# Patient Record
Sex: Male | Born: 1990 | Race: Black or African American | Hispanic: No | Marital: Single | Smoking: Never smoker
Health system: Southern US, Community
[De-identification: ages and names within clinical notes are randomized; demographics above are authoritative.]

## PROBLEM LIST (undated history)

## (undated) DIAGNOSIS — D573 Sickle-cell trait: Secondary | ICD-10-CM

## (undated) HISTORY — DX: Sickle-cell trait: D57.3

---
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
## 2012-03-25 ENCOUNTER — Ambulatory Visit (INDEPENDENT_AMBULATORY_CARE_PROVIDER_SITE_OTHER): Payer: Self-pay | Admitting: Family Medicine

## 2012-03-25 ENCOUNTER — Encounter: Payer: Self-pay | Admitting: Family Medicine

## 2012-03-25 VITALS — BP 127/84 | HR 114 | Temp 98.9°F | Ht 72.0 in | Wt 165.0 lb

## 2012-03-25 DIAGNOSIS — R109 Unspecified abdominal pain: Secondary | ICD-10-CM | POA: Insufficient documentation

## 2012-03-25 DIAGNOSIS — B360 Pityriasis versicolor: Secondary | ICD-10-CM

## 2012-03-25 DIAGNOSIS — R000 Tachycardia, unspecified: Secondary | ICD-10-CM

## 2012-03-25 DIAGNOSIS — I498 Other specified cardiac arrhythmias: Secondary | ICD-10-CM

## 2012-03-25 LAB — CBC
HCT: 41 % (ref 39.0–52.0)
MCV: 69.5 fL — ABNORMAL LOW (ref 78.0–100.0)
Platelets: 298 10*3/uL (ref 150–400)
RBC: 5.9 MIL/uL — ABNORMAL HIGH (ref 4.22–5.81)
WBC: 3.2 10*3/uL — ABNORMAL LOW (ref 4.0–10.5)

## 2012-03-25 LAB — COMPREHENSIVE METABOLIC PANEL
CO2: 27 mEq/L (ref 19–32)
Calcium: 9.7 mg/dL (ref 8.4–10.5)
Creat: 1.24 mg/dL (ref 0.50–1.35)
Glucose, Bld: 89 mg/dL (ref 70–99)
Total Bilirubin: 0.8 mg/dL (ref 0.3–1.2)

## 2012-03-25 NOTE — Assessment & Plan Note (Signed)
Treat with selsun blue

## 2012-03-25 NOTE — Progress Notes (Signed)
  Subjective:    Patient ID: Gregory Lucero, male    DOB: 10-04-1990, 21 y.o.   MRN: [id number]  HPI Here to reestablish care  Pain in bilateral abdomen and low back.  On and off for a few days.  Not severe.  Slightly worse with movement.  No nausea or vomiting or fever or diarrhea or sick contacts.  Is not having the pain now  Rash - hypopigmented blotches on back.  No other symptoms.  No new medications  PMH Elevated LFTs - last check had resolved Tachycardia - had been on metroprolol not taking now  Patient reports no  vision/ hearing changes,anorexia, weight change, fever ,adenopathy, persistant / recurrent hoarseness, swallowing issues, chest pain, edema,persistant / recurrent cough, hemoptysis, dyspnea(rest, exertional, paroxysmal nocturnal), gastrointestinal  bleeding (melena, rectal bleeding), , excessive heart burn, GU symptoms(dysuria, hematuria, pyuria, voiding/incontinence  Issues) syncope, focal weakness, severe memory loss,, depression, anxiety, abnormal bruising/bleeding, major joint swelling.     Review of Systems     Objective:   Physical Exam Neck:  No deformities, thyromegaly, masses, or tenderness noted.   Supple with full range of motion without pain. Mouth - no lesions, mucous membranes are moist, no decaying teeth  Heart - Regular rate and rhythm.  No murmurs, gallops or rubs.   Was tachycardic after moving on to exam table but returned to normal at rest Lungs:  Normal respiratory effort, chest expands symmetrically. Lungs are clear to auscultation, no crackles or wheezes. Abdomen: soft and non-tender without masses, organomegaly or hernias noted.  No guarding or rebound Extremities:  No cyanosis, edema, or deformity noted with good range of motion of all major joints.  Skin:  Intact without suspicious lesions does have faint hypopigmented lesion on back consistent with tinea          Assessment & Plan:

## 2012-03-25 NOTE — Patient Instructions (Addendum)
Monitor your pulse and blood pressure once a week The pulse should be between 60-90 when you are resting Your blood pressure should be less than 140/90 If the readings are regularly above these number then come back  I will call you if your tests are not good.  Otherwise I will send you a letter.  If you do not hear from me with in 2 weeks please call our office.     Use Selsun blue on your back First time use for 20 minutes then wash off if no bad reaction then leave on over night repeat again in one week  If the abdomen pain gets worse especially with nausea or vomiting or high fever > 101 then call me

## 2012-03-25 NOTE — Assessment & Plan Note (Signed)
No longer taking metoprolol.  No symptoms. Exercising regularly without problems

## 2012-03-25 NOTE — Assessment & Plan Note (Signed)
No red flags on history or exam but given his history of LFT elevation will check labs

## 2012-03-28 ENCOUNTER — Encounter: Payer: Self-pay | Admitting: Family Medicine

## 2012-09-30 ENCOUNTER — Telehealth: Payer: Self-pay | Admitting: *Deleted

## 2012-09-30 NOTE — Telephone Encounter (Signed)
Chart reviewed, pt has not been taking med/ last ov with PCP shows HR 114, and Dr confirmed pt is not taking medication. I called his pharmacy and the last time med was picked up was 10/29/2011. I called pt and left msg that letter can not be written as requested since things are not resolved. Pt needs yearly app. Asked pt to call back/ number provided.

## 2012-09-30 NOTE — Telephone Encounter (Signed)
Message copied by Antony Odea on Mon Sep 30, 2012 10:20 AM ------      Message from: Erle Crocker D      Created: Mon Sep 30, 2012  9:54 AM      Regarding: Letter Wrote       Jodette,            Pt Left Voicemail on my phone asking For a Note to Be       Wrote on [employer]. Please call him back at      [phone]            Thanks,Kim  ------

## 2012-09-30 NOTE — Telephone Encounter (Signed)
Message copied by Antony Odea on Mon Sep 30, 2012 10:49 AM ------      Message from: Erle Crocker D      Created: Mon Sep 30, 2012 10:41 AM      Regarding: Letter        Jodette,            Pt called back in and stated his Note Need to say:      1.Dates of Service      2.Effect medicine Had      3. What Was Done to Resolve Situation & everything Is       Under Control now.            Thanks,Kim        ------

## 2012-09-30 NOTE — Telephone Encounter (Signed)
msg left to call back and let me know what kind of information needs to be on the letter, number provided.

## 2012-10-11 NOTE — Telephone Encounter (Signed)
Pt is aware that he needs to be seen prior Md writes the letter  Requested. Pt states did not refill the  Medication, because his  BP was doing fine, and he did not need to take the medication, but he needed to continue execessing   per his  PCP. Pt was offered to make  A yearly  Appointment, but pt needs to ask his  mother first, so he will call back to make the appointment.

## 2012-10-11 NOTE — Telephone Encounter (Signed)
Follow up  Pt states he needs a letter from the doctor explaining his condition, the medication and the dates of services that he saw the doctor.  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

## 2012-10-21 ENCOUNTER — Telehealth: Payer: Self-pay | Admitting: *Deleted

## 2012-10-21 NOTE — Telephone Encounter (Signed)
Spoke with his mother explaining that the medication is used for HR control, told her he needs a follow up app. She said she will call back and make an app, spent 10 minutes discussing med/ exercise and hr and its long term effects. Mother verbalized understanding.

## 2012-10-21 NOTE — Telephone Encounter (Signed)
Spoke with his mother explaining that the medication is used for HR control, told her he needs a follow up app. She said she will call back and make an app, spent 10 minutes discussing med/ exercise and hr and its long term effects. Mother verbalized understanding. 

## 2013-02-10 ENCOUNTER — Ambulatory Visit (INDEPENDENT_AMBULATORY_CARE_PROVIDER_SITE_OTHER): Payer: Self-pay | Admitting: Family Medicine

## 2013-02-10 ENCOUNTER — Encounter: Payer: Self-pay | Admitting: Family Medicine

## 2013-02-10 VITALS — BP 169/81 | HR 125 | Temp 99.0°F | Wt 151.0 lb

## 2013-02-10 DIAGNOSIS — R634 Abnormal weight loss: Secondary | ICD-10-CM | POA: Insufficient documentation

## 2013-02-10 DIAGNOSIS — R109 Unspecified abdominal pain: Secondary | ICD-10-CM

## 2013-02-10 LAB — CBC WITH DIFFERENTIAL/PLATELET
Basophils Absolute: 0 10*3/uL (ref 0.0–0.1)
Basophils Relative: 1 % (ref 0–1)
Eosinophils Absolute: 0 10*3/uL (ref 0.0–0.7)
Eosinophils Relative: 0 % (ref 0–5)
Lymphs Abs: 0.8 10*3/uL (ref 0.7–4.0)
MCH: 22.4 pg — ABNORMAL LOW (ref 26.0–34.0)
MCHC: 32.7 g/dL (ref 30.0–36.0)
MCV: 68.4 fL — ABNORMAL LOW (ref 78.0–100.0)
Neutrophils Relative %: 74 % (ref 43–77)
Platelets: 266 10*3/uL (ref 150–400)
RBC: 5.8 MIL/uL (ref 4.22–5.81)
RDW: 15.6 % — ABNORMAL HIGH (ref 11.5–15.5)

## 2013-02-10 LAB — COMPREHENSIVE METABOLIC PANEL
AST: 18 U/L (ref 0–37)
Albumin: 4.4 g/dL (ref 3.5–5.2)
BUN: 7 mg/dL (ref 6–23)
Calcium: 9 mg/dL (ref 8.4–10.5)
Chloride: 104 mEq/L (ref 96–112)
Glucose, Bld: 83 mg/dL (ref 70–99)
Potassium: 4.1 mEq/L (ref 3.5–5.3)
Sodium: 140 mEq/L (ref 135–145)
Total Protein: 6.7 g/dL (ref 6.0–8.3)

## 2013-02-10 LAB — POCT SEDIMENTATION RATE: POCT SED RATE: 2 mm/hr (ref 0–22)

## 2013-02-10 NOTE — Assessment & Plan Note (Signed)
Long standing but now worsened with weight loss.  No specific clues on exam.  Will check labs and depending on findings perhaps proceed with imaging

## 2013-02-10 NOTE — Progress Notes (Signed)
  Subjective:    Patient ID: Gregory Lucero, male    DOB: 1990-10-25, 22 y.o.   MRN: [id number]  HPI  Weight Loss Abdomen Pain Has lost 15 lbs in last year.  Prior to that was heavier but was "eating on meal plan at school" Reports and erractic schedule and sometimes does not eat a lot.  Has constant left sided upper abdomen pain that started as intermittent a year ago.  Hard to describe the pain.  Worse with movement.  "Feels like something is there".  No nausea or vomiting or diarrhea. Occasional has BRB on toilet paper.  No fevers or night sweats or adenopathy.  Does have frequent sinus headaches. Has had a nose bleed before. Was exercising regularly but stopped a week or so ago due to weight loss.   Review of Symptoms - see HPI  PMH - Smoking status noted.      Review of Systems     Objective:   Physical Exam  Alert no acute distress healthy appearing Neck:  No deformities, thyromegaly, masses, or tenderness noted.   Supple with full range of motion without pain. Mouth - no lesions, mucous membranes are moist, no decaying teeth  Heart - Regular rate and rhythm.  No murmurs, gallops or rubs.    Lungs:  Normal respiratory effort, chest expands symmetrically. Lungs are clear to auscultation, no crackles or wheezes. Abdomen: soft and essentially non-tender without masses, organomegaly or hernias noted.  No guarding or rebound.  When push deeply in LUQ mild discomfort No groin or axillary or neck adenopathy Extremities:  No cyanosis, edema, or deformity noted with good range of motion of all major joints.         Assessment & Plan:

## 2013-02-10 NOTE — Assessment & Plan Note (Signed)
New 15lbs in last year involuntary.  No red flags on history or exam.  Will check screening labs and follow closely

## 2013-02-10 NOTE — Patient Instructions (Signed)
Good to see you today!  Thanks for coming in.  I will call you/Mom with the results of the blood tests  Keep a list of every thing you eat for the next several days

## 2013-02-13 ENCOUNTER — Telehealth: Payer: Self-pay | Admitting: Family Medicine

## 2013-02-13 ENCOUNTER — Encounter: Payer: Self-pay | Admitting: Family Medicine

## 2013-02-13 DIAGNOSIS — R109 Unspecified abdominal pain: Secondary | ICD-10-CM

## 2013-02-13 DIAGNOSIS — R634 Abnormal weight loss: Secondary | ICD-10-CM

## 2013-02-13 MED ORDER — OMEPRAZOLE 20 MG PO CPDR: 20.0000 mg | DELAYED_RELEASE_CAPSULE | Freq: Every day | ORAL | Status: DC

## 2013-02-13 NOTE — Assessment & Plan Note (Signed)
Sent letter detailing -  Eat a large balanced diet every day  2. Don't do any exercises that make your stomach/side hurt 3.  Try omeprazole 20 mg every AM for 2 weeks to see if the abdomen pain is better - I sent to your drug store 4.  Weigh yourself every week.  If you continue to lose weight come back to our office in December  If weight loss persists would obtain Abdomen CT

## 2013-02-13 NOTE — Telephone Encounter (Signed)
Spoke with his mom - see letter for details

## 2013-02-13 NOTE — Assessment & Plan Note (Signed)
Lab work up unrevealing.  Try course of PPI and monitor weight and symptoms

## 2013-03-23 ENCOUNTER — Telehealth: Payer: Self-pay | Admitting: Family Medicine

## 2013-03-23 ENCOUNTER — Emergency Department (HOSPITAL_COMMUNITY)
Admission: EM | Admit: 2013-03-23 | Discharge: 2013-03-23 | Disposition: A | Discharge status: Home / Self Care | Payer: Medicaid Other | Attending: Emergency Medicine | Admitting: Emergency Medicine

## 2013-03-23 ENCOUNTER — Encounter (HOSPITAL_COMMUNITY): Payer: Self-pay | Admitting: Emergency Medicine

## 2013-03-23 DIAGNOSIS — R109 Unspecified abdominal pain: Secondary | ICD-10-CM

## 2013-03-23 DIAGNOSIS — R112 Nausea with vomiting, unspecified: Secondary | ICD-10-CM | POA: Insufficient documentation

## 2013-03-23 DIAGNOSIS — R509 Fever, unspecified: Secondary | ICD-10-CM | POA: Insufficient documentation

## 2013-03-23 DIAGNOSIS — G8929 Other chronic pain: Secondary | ICD-10-CM | POA: Insufficient documentation

## 2013-03-23 DIAGNOSIS — R197 Diarrhea, unspecified: Secondary | ICD-10-CM | POA: Insufficient documentation

## 2013-03-23 DIAGNOSIS — R634 Abnormal weight loss: Secondary | ICD-10-CM | POA: Insufficient documentation

## 2013-03-23 DIAGNOSIS — R630 Anorexia: Secondary | ICD-10-CM | POA: Insufficient documentation

## 2013-03-23 DIAGNOSIS — Z79899 Other long term (current) drug therapy: Secondary | ICD-10-CM | POA: Insufficient documentation

## 2013-03-23 DIAGNOSIS — R1031 Right lower quadrant pain: Secondary | ICD-10-CM | POA: Insufficient documentation

## 2013-03-23 DIAGNOSIS — Z862 Personal history of diseases of the blood and blood-forming organs and certain disorders involving the immune mechanism: Secondary | ICD-10-CM | POA: Insufficient documentation

## 2013-03-23 DIAGNOSIS — R51 Headache: Secondary | ICD-10-CM | POA: Insufficient documentation

## 2013-03-23 LAB — COMPREHENSIVE METABOLIC PANEL
AST: 27 U/L (ref 0–37)
Albumin: 4.2 g/dL (ref 3.5–5.2)
Alkaline Phosphatase: 80 U/L (ref 39–117)
BUN: 9 mg/dL (ref 6–23)
Chloride: 101 mEq/L (ref 96–112)
Potassium: 4 mEq/L (ref 3.5–5.1)
Total Bilirubin: 0.6 mg/dL (ref 0.3–1.2)
Total Protein: 7.5 g/dL (ref 6.0–8.3)

## 2013-03-23 LAB — CBC WITH DIFFERENTIAL/PLATELET
Basophils Absolute: 0 10*3/uL (ref 0.0–0.1)
Basophils Relative: 1 % (ref 0–1)
Eosinophils Absolute: 0.1 10*3/uL (ref 0.0–0.7)
MCH: 23 pg — ABNORMAL LOW (ref 26.0–34.0)
MCHC: 33.2 g/dL (ref 30.0–36.0)
Monocytes Relative: 14 % — ABNORMAL HIGH (ref 3–12)
Neutro Abs: 2.4 10*3/uL (ref 1.7–7.7)
Neutrophils Relative %: 58 % (ref 43–77)
Platelets: 237 10*3/uL (ref 150–400)
RDW: 14.6 % (ref 11.5–15.5)

## 2013-03-23 LAB — LIPASE, BLOOD: Lipase: 34 U/L (ref 11–59)

## 2013-03-23 NOTE — Telephone Encounter (Signed)
Error

## 2013-03-23 NOTE — Telephone Encounter (Signed)
[facility] Emergency Line   Patient calling with pain in lower right stomach that he thought was appendicitis but is not sure. He describes pain as "extreme" making it difficult for him to move. Pain lasted 1 hour and went away. He also thinks it could have been something he ate because he remembers eating "bad meat." He has never had similar pain and also thinks it could have been bad gas. Pain is now completely gone on his right side but his chronic left abdominal pain is still present. He does not go into detail when asked what this pain is from.  He reports nausea, vomiting, loss of appetite, fevers, and chills.   On review of PMH, pt has h/o sinus tachycardia, weight loss, and abdominal pain with unrevealing lab workup. Per his PCP, he is to try a course of PPI and follow up.   Assessment / Plan: 22 y.o. male with h/o abdominal pain on PPI, weight loss, and sinus tachycardia calling with a 1 hour episode of severe abdominal pain in RLQ that has resolved. DDx includes appendicitis, gas, gastritis, gastroenteritis. With fever, severe RLQ abdominal pain, and vomiting, high concern for appendicitis though gastroenteritis is much more likely. - Explained to Gregory Lucero and his mother that I am limited over the phone and cannot perform a needed physical exam. - Gregory Lucero to go to Urgent Care or the Emergency Room today for evaluation since clinic is closed.  - Pt and mother voice understanding.  Gregory Singleton, MD

## 2013-03-23 NOTE — ED Notes (Signed)
He c/o lower abd. Pain plus one episode of emesis two days ago.  He is in no distress.

## 2013-03-23 NOTE — ED Provider Notes (Signed)
I saw and evaluated the patient, reviewed the resident's note and I agree with the findings and plan.   .Face to face Exam:  General:  Awake HEENT:  Atraumatic Resp:  Normal effort Abd:  Nondistended Neuro:No focal weakness    Nelia Shi, MD 03/23/13 1139

## 2013-03-23 NOTE — ED Provider Notes (Signed)
CSN: [id number]     Arrival date & time 03/23/13  4098 History   None    Chief Complaint  Patient presents with  . Abdominal Pain   (Consider location/radiation/quality/duration/timing/severity/associated sxs/prior Treatment) HPI Gregory Lucero is a 22 y.o. male w/ PMHx of Sickle Cell Trait, presents to the ED w/ complaints of abdominal pain. The patient claims his abdominal discomfort began last Friday after he thinks he ate some spoiled ground beef, resulting in abdominal pain, nausea, vomiting, diarrhea, and subjective fever and chills. For the last 2 days now he complains of lower abdominal pain, mainly in the RLQ, 8/10 in severity, at its worst, now he has no pain whatsoever. The patient claims to have had associated decrease in appetite, nausea, and still w/ diarrhea.  Follows w/ PCP, Dr. Deirdre Priest, who has worked him up for some chronic abdominal pain as well as weight loss, all previous labs and workup negative.  Otherwise, patient denies any dizziness, lightheadedness, palpitations, SOB, chest pain, dysuria, or hematuria.`  Past Medical History  Diagnosis Date  . Sickle cell trait    History reviewed. No pertinent past surgical history. History reviewed. No pertinent family history. History  Substance Use Topics  . Smoking status: Never Smoker   . Smokeless tobacco: Not on file  . Alcohol Use: No    Review of Systems General: Positive for subjective fever/chills, decreased appetite. Denies diaphoresis and fatigue.  Respiratory: Denies SOB, DOE, cough, chest tightness, and wheezing.   Cardiovascular: Denies chest pain, palpitations and leg swelling.  Gastrointestinal: Positive for nausea, vomiting, abdominal pain, and diarrhea. Denies constipation, blood in stool and abdominal distention.  Genitourinary: Denies dysuria, urgency, frequency, hematuria, flank pain and difficulty urinating.  Endocrine: Denies hot or cold intolerance, sweats, polyuria,  polydipsia. Musculoskeletal: Denies myalgias, back pain, joint swelling, arthralgias and gait problem.  Skin: Denies pallor, rash and wounds.  Neurological: Positive for intermittent headaches. Denies dizziness, seizures, syncope, weakness, lightheadedness, numbness.  Psychiatric/Behavioral: Denies mood changes, confusion, nervousness, sleep disturbance and agitation.  Allergies  Review of patient's allergies indicates no known allergies.  Home Medications   Current Outpatient Rx  Name  Route  Sig  Dispense  Refill  . calcium carbonate (OS-CAL) 1250 MG chewable tablet   Oral   Chew 2 tablets by mouth daily.         Marland Kitchen senna-docusate (SENOKOT-S) 8.6-50 MG per tablet   Oral   Take 1 tablet by mouth once.          Physical Exam Filed Vitals:   03/23/13 1001  BP: 125/81  Pulse: 75  Temp: 98 F (36.7 C)  TempSrc: Oral  Resp: 16  Height: 6' (1.829 m)  Weight: 152 lb (68.947 kg)  SpO2: 100%  General: Vital signs reviewed.  Patient is a well-developed and well-nourished, in no acute distress and cooperative with exam. Alert and oriented x3.  Head: Normocephalic and atraumatic. Eyes: PERRL, EOMI, conjunctivae normal, No scleral icterus.  Neck: Supple, trachea midline, normal ROM, No JVD, masses, thyromegaly, or carotid bruit present.  Cardiovascular: RRR, S1 normal, S2 normal, no murmurs, gallops, or rubs. Pulmonary/Chest: Normal respiratory effort, CTAB, no wheezes, rales, or rhonchi. Abdominal: Soft, non-tender, non-distended, bowel sounds are normal, no masses, organomegaly, or guarding present.  Musculoskeletal: No joint deformities, erythema, or stiffness, ROM full and no nontender. Extremities: No swelling or edema,  pulses symmetric and intact bilaterally. No cyanosis or clubbing. Neurological: A&O x3, Strength is normal and symmetric bilaterally, cranial nerve II-XII are  grossly intact, no focal motor deficit, sensory intact to light touch bilaterally.  Skin: Warm, dry  and intact. No rashes or erythema. Psychiatric: Normal mood and affect. speech and behavior is normal. Cognition and memory are normal.   ED Course  Procedures (including critical care time) Labs Review Labs Reviewed  CBC WITH DIFFERENTIAL - Abnormal; Notable for the following:    RBC 5.95 (*)    MCV 69.4 (*)    MCH 23.0 (*)    Monocytes Relative 14 (*)    All other components within normal limits  COMPREHENSIVE METABOLIC PANEL - Abnormal; Notable for the following:    GFR calc non Af Amer 86 (*)    All other components within normal limits  LIPASE, BLOOD   Imaging Review No results found.  EKG Interpretation   None       MDM   Gregory Lucero is a 22 y.o. male w/ PMHx of Sickle Cell Trait, presents to the ED w/ complaints of abdominal pain, nausea, vomiting, and diarrhea. Patient admitted to eating some spoiled ground beef last Friday and has the symptoms ever since. Most likely viral gastroenteritis or food poisoning at this time.  -CBC, CMET, lipase wnl  Patient stable for discharge w/ follow up with PCP in 4-6 weeks.    Courtney Paris, MD 03/23/13 1136

## 2013-04-27 ENCOUNTER — Emergency Department (HOSPITAL_COMMUNITY): Payer: Medicaid Other

## 2013-04-27 ENCOUNTER — Emergency Department (HOSPITAL_COMMUNITY)
Admission: EM | Admit: 2013-04-27 | Discharge: 2013-04-27 | Disposition: A | Discharge status: Home / Self Care | Payer: Medicaid Other | Attending: Emergency Medicine | Admitting: Emergency Medicine

## 2013-04-27 ENCOUNTER — Encounter (HOSPITAL_COMMUNITY): Payer: Self-pay | Admitting: Emergency Medicine

## 2013-04-27 DIAGNOSIS — N453 Epididymo-orchitis: Secondary | ICD-10-CM | POA: Insufficient documentation

## 2013-04-27 DIAGNOSIS — D573 Sickle-cell trait: Secondary | ICD-10-CM | POA: Insufficient documentation

## 2013-04-27 DIAGNOSIS — Z79899 Other long term (current) drug therapy: Secondary | ICD-10-CM | POA: Insufficient documentation

## 2013-04-27 DIAGNOSIS — N451 Epididymitis: Secondary | ICD-10-CM | POA: Diagnosis present

## 2013-04-27 LAB — URINALYSIS W MICROSCOPIC + REFLEX CULTURE
BILIRUBIN URINE: NEGATIVE
Glucose, UA: NEGATIVE mg/dL
Hgb urine dipstick: NEGATIVE
KETONES UR: NEGATIVE mg/dL
Leukocytes, UA: NEGATIVE
NITRITE: NEGATIVE
PH: 6.5 (ref 5.0–8.0)
Protein, ur: NEGATIVE mg/dL
Specific Gravity, Urine: 1.006 (ref 1.005–1.030)
Urine-Other: NONE SEEN
Urobilinogen, UA: 0.2 mg/dL (ref 0.0–1.0)

## 2013-04-27 MED ORDER — DOXYCYCLINE HYCLATE 100 MG PO CAPS: 100.0000 mg | ORAL_CAPSULE | Freq: Two times a day (BID) | ORAL | Status: DC

## 2013-04-27 MED ORDER — DOXYCYCLINE HYCLATE 100 MG PO TABS
100.0000 mg | ORAL_TABLET | Freq: Once | ORAL | Status: AC
  Administered 2013-04-27: 100 mg via ORAL
  Filled 2013-04-27: qty 1

## 2013-04-27 MED ORDER — CEFTRIAXONE SODIUM 250 MG IJ SOLR
250.0000 mg | Freq: Once | INTRAMUSCULAR | Status: AC
  Administered 2013-04-27: 250 mg via INTRAMUSCULAR
  Filled 2013-04-27 (×2): qty 250

## 2013-04-27 MED ORDER — DOXYCYCLINE HYCLATE 100 MG PO CAPS: 100.0000 mg | ORAL_CAPSULE | Freq: Two times a day (BID) | ORAL | Status: AC

## 2013-04-27 MED ORDER — LIDOCAINE HCL 1 % IJ SOLN
INTRAMUSCULAR | Status: AC
  Administered 2013-04-27: 1.2 mL
  Filled 2013-04-27: qty 20

## 2013-04-27 NOTE — Discharge Instructions (Signed)
Epididymitis  Epididymitis is a swelling (inflammation) of the epididymis. The epididymis is a cord-like structure along the back part of the testicle. Epididymitis is usually, but not always, caused by infection. This is usually a sudden problem beginning with chills, fever and pain behind the scrotum and in the testicle. There may be swelling and redness of the testicle.  DIAGNOSIS   Physical examination will reveal a tender, swollen epididymis. Sometimes, cultures are obtained from the urine or from prostate secretions to help find out if there is an infection or if the cause is a different problem. Sometimes, blood work is performed to see if your white blood cell count is elevated and if a germ (bacterial) or viral infection is present. Using this knowledge, an appropriate medicine which kills germs (antibiotic) can be chosen by your caregiver. A viral infection causing epididymitis will most often go away (resolve) without treatment.  HOME CARE INSTRUCTIONS   · Hot sitz baths for 20 minutes, 4 times per day, may help relieve pain.  · Only take over-the-counter or prescription medicines for pain, discomfort or fever as directed by your caregiver.  · Take all medicines, including antibiotics, as directed. Take the antibiotics for the full prescribed length of time even if you are feeling better.  · It is very important to keep all follow-up appointments.  SEEK IMMEDIATE MEDICAL CARE IF:   · You have a fever.  · You have pain not relieved with medicines.  · You have any worsening of your problems.  · Your pain seems to come and go.  · You develop pain, redness, and swelling in the scrotum and surrounding areas.  MAKE SURE YOU:   · Understand these instructions.  · Will watch your condition.  · Will get help right away if you are not doing well or get worse.  Document Released: 03/17/2000 Document Revised: 06/12/2011 Document Reviewed: 02/04/2009  ExitCare® Patient Information ©2014 [employer].

## 2013-04-27 NOTE — ED Notes (Signed)
PA at bedside.

## 2013-04-27 NOTE — ED Provider Notes (Signed)
CSN: [id number]     Arrival date & time 04/27/13  1823 History   First MD Initiated Contact with Patient 04/27/13 2038     Chief Complaint  Patient presents with  . Testicle Pain   (Consider location/radiation/quality/duration/timing/severity/associated sxs/prior Treatment) Patient is a 23 y.o. male presenting with testicular pain. The history is provided by the patient.  Testicle Pain This is a new problem. Episode onset: 2 days ago. The problem occurs constantly. The problem has not changed since onset.Pertinent negatives include no chest pain, no abdominal pain, no headaches and no shortness of breath. Nothing aggravates the symptoms. Nothing relieves the symptoms. He has tried nothing for the symptoms. The treatment provided no relief.    Past Medical History  Diagnosis Date  . Sickle cell trait    No past surgical history on file. No family history on file. History  Substance Use Topics  . Smoking status: Never Smoker   . Smokeless tobacco: Not on file  . Alcohol Use: No    Review of Systems  Constitutional: Negative for fever.  HENT: Negative for drooling and rhinorrhea.   Eyes: Negative for pain.  Respiratory: Negative for cough and shortness of breath.   Cardiovascular: Negative for chest pain and leg swelling.  Gastrointestinal: Negative for nausea, vomiting, abdominal pain and diarrhea.  Genitourinary: Positive for testicular pain. Negative for dysuria and hematuria.  Musculoskeletal: Negative for gait problem and neck pain.  Skin: Negative for color change.  Neurological: Negative for numbness and headaches.  Hematological: Negative for adenopathy.  Psychiatric/Behavioral: Negative for behavioral problems.  All other systems reviewed and are negative.    Allergies  Review of patient's allergies indicates no known allergies.  Home Medications   Current Outpatient Rx  Name  Route  Sig  Dispense  Refill  . calcium carbonate (OS-CAL) 1250 MG chewable tablet    Oral   Chew 2 tablets by mouth daily.         Marland Kitchen senna-docusate (SENOKOT-S) 8.6-50 MG per tablet   Oral   Take 1 tablet by mouth once.          BP 152/85  Pulse 102  Temp(Src) 98 F (36.7 C) (Oral)  SpO2 99% Physical Exam  Nursing note and vitals reviewed. Constitutional: He is oriented to person, place, and time. He appears well-developed and well-nourished.  HENT:  Head: Normocephalic and atraumatic.  Right Ear: External ear normal.  Left Ear: External ear normal.  Nose: Nose normal.  Mouth/Throat: Oropharynx is clear and moist. No oropharyngeal exudate.  Eyes: Conjunctivae and EOM are normal. Pupils are equal, round, and reactive to light.  Neck: Normal range of motion. Neck supple.  Cardiovascular: Normal rate, regular rhythm, normal heart sounds and intact distal pulses.  Exam reveals no gallop and no friction rub.   No murmur heard. Pulmonary/Chest: Effort normal and breath sounds normal. No respiratory distress. He has no wheezes.  Abdominal: Soft. Bowel sounds are normal. He exhibits no distension. There is no tenderness. There is no rebound and no guarding.  Genitourinary: Penis normal. No penile tenderness.  Normal appearing testicles. No focal tenderness to palpation of the testicles or epididymis bilaterally. Normal cremasteric reflex bilaterally. No inguinal adenopathy or evidence of inguinal hernia. Normal-appearing penis without discharge.  Musculoskeletal: Normal range of motion. He exhibits no edema and no tenderness.  Neurological: He is alert and oriented to person, place, and time.  Skin: Skin is warm and dry.  Psychiatric: He has a normal mood and affect. His  behavior is normal.    ED Course  Procedures (including critical care time) Labs Review Labs Reviewed  GC/CHLAMYDIA PROBE AMP  URINALYSIS W MICROSCOPIC + REFLEX CULTURE   Imaging Review US Scrotum  04/27/2013   CLINICAL DATA:  Right testicular pain, rule out torsion.  EXAM: SCROTAL ULTRASOUND   DOPPLER ULTRASOUND OF THE TESTICLES  TECHNIQUE: Complete ultrasound examination of the testicles, epididymis, and other scrotal structures was performed. Color and spectral Doppler ultrasound were also utilized to evaluate blood flow to the testicles.  COMPARISON:  None.  FINDINGS: Right testicle  Measurements: 2.0 x 2.3 x 4.5 cm. No mass or microlithiasis visualized.  Left testicle  Measurements: 1.9 x 2.3 x 4.2 cm. No mass or microlithiasis visualized.  Right epididymis:  Normal in size and appearance.  Left epididymis:  Normal in size and appearance.  Hydrocele:  None visualized.  Varicocele:  None visualized.  Pulsed Doppler interrogation of both testes demonstrates low resistance arterial and venous waveforms bilaterally and symmetrically.  IMPRESSION: Normal testicular ultrasound.  No evidence of torsion.   Electronically Signed   By: Elberta Fortis M.D.   On: 04/27/2013 21:52   [place] Art/ven Flow Abd Pelv Doppler  04/27/2013   CLINICAL DATA:  Right testicular pain, rule out torsion.  EXAM: SCROTAL ULTRASOUND  DOPPLER ULTRASOUND OF THE TESTICLES  TECHNIQUE: Complete ultrasound examination of the testicles, epididymis, and other scrotal structures was performed. Color and spectral Doppler ultrasound were also utilized to evaluate blood flow to the testicles.  COMPARISON:  None.  FINDINGS: Right testicle  Measurements: 2.0 x 2.3 x 4.5 cm. No mass or microlithiasis visualized.  Left testicle  Measurements: 1.9 x 2.3 x 4.2 cm. No mass or microlithiasis visualized.  Right epididymis:  Normal in size and appearance.  Left epididymis:  Normal in size and appearance.  Hydrocele:  None visualized.  Varicocele:  None visualized.  Pulsed Doppler interrogation of both testes demonstrates low resistance arterial and venous waveforms bilaterally and symmetrically.  IMPRESSION: Normal testicular ultrasound.  No evidence of torsion.   Electronically Signed   By: Elberta Fortis M.D.   On: 04/27/2013 21:52    EKG  Interpretation   None       MDM   1. Epididymitis    8:52 PM 23 y.o. male who presents with right testicular pain for 2 days. The patient states he awoke with a dull discomfort 2 days ago. He notes it has been worse with walking and running. He denies any fevers, vomiting, diarrhea, or abdominal pain. He is afebrile and mildly tachycardic here. His pain is currently 4/10 and he refuses any pain medicine. Has a normal genital exam. Low suspicion for torsion but will get ultrasound to rule this out. Will get screening UA and gc/chl swab.   He denies history of STDs. Has not had sex in several months.  11:24 PM: [place] neg for torsion. UA non-contrib. Possible epididymitis. Traumatic vs Infectious in nature. Will cover w/ rocephin IM and doxy x 10 days. I offered Rx pain medicine, pt ok w/ tylenol/ibuprofen at home. I have discussed the diagnosis/risks/treatment options with the patient and believe the pt to be eligible for discharge home to follow-up with pcp as needed. We also discussed returning to the ED immediately if new or worsening sx occur. We discussed the sx which are most concerning (e.g., continued or worsening testicular pain, fever, dysuria, penile d/c) that necessitate immediate return. Any new prescriptions provided to the patient are listed below.  New  Prescriptions   DOXYCYCLINE (VIBRAMYCIN) 100 MG CAPSULE    Take 1 capsule (100 mg total) by mouth 2 (two) times daily. One po bid x 10 days       Junius ArgyleForrest S Wilson Sample, MD 04/27/13 2326

## 2013-04-27 NOTE — ED Notes (Signed)
Pt c/o an "irritation" in rt testicle.  States that it is not painful but when he stretches out it leg, he can feel it pull in his rt testicle.

## 2013-04-28 LAB — GC/CHLAMYDIA PROBE AMP
CT Probe RNA: NEGATIVE
GC PROBE AMP APTIMA: NEGATIVE

## 2013-06-06 ENCOUNTER — Encounter: Payer: Self-pay | Admitting: Family Medicine

## 2013-06-12 ENCOUNTER — Emergency Department (HOSPITAL_COMMUNITY)
Admission: EM | Admit: 2013-06-12 | Discharge: 2013-06-13 | Disposition: A | Discharge status: Home / Self Care | Payer: Medicaid Other | Attending: Emergency Medicine | Admitting: Emergency Medicine

## 2013-06-12 ENCOUNTER — Emergency Department (HOSPITAL_COMMUNITY): Payer: Medicaid Other

## 2013-06-12 ENCOUNTER — Encounter (HOSPITAL_COMMUNITY): Payer: Self-pay | Admitting: Emergency Medicine

## 2013-06-12 DIAGNOSIS — R059 Cough, unspecified: Secondary | ICD-10-CM | POA: Insufficient documentation

## 2013-06-12 DIAGNOSIS — R0602 Shortness of breath: Secondary | ICD-10-CM | POA: Insufficient documentation

## 2013-06-12 DIAGNOSIS — R05 Cough: Secondary | ICD-10-CM | POA: Insufficient documentation

## 2013-06-12 DIAGNOSIS — J3489 Other specified disorders of nose and nasal sinuses: Secondary | ICD-10-CM | POA: Insufficient documentation

## 2013-06-12 DIAGNOSIS — Z862 Personal history of diseases of the blood and blood-forming organs and certain disorders involving the immune mechanism: Secondary | ICD-10-CM | POA: Insufficient documentation

## 2013-06-12 DIAGNOSIS — L539 Erythematous condition, unspecified: Secondary | ICD-10-CM | POA: Insufficient documentation

## 2013-06-12 DIAGNOSIS — R110 Nausea: Secondary | ICD-10-CM | POA: Insufficient documentation

## 2013-06-12 DIAGNOSIS — R109 Unspecified abdominal pain: Secondary | ICD-10-CM | POA: Insufficient documentation

## 2013-06-12 MED ORDER — BENZONATATE 100 MG PO CAPS: 100.0000 mg | ORAL_CAPSULE | Freq: Three times a day (TID) | ORAL | Status: AC

## 2013-06-12 MED ORDER — IBUPROFEN 800 MG PO TABS: 800.0000 mg | ORAL_TABLET | Freq: Three times a day (TID) | ORAL | Status: DC

## 2013-06-12 NOTE — Discharge Instructions (Signed)
Your x-ray did not show any concerning causes of your cough and chest discomfort. Please use medications prescribed to help with your symptoms and followup with a primary care provider for continued evaluation and treatment. Return for any changing or worsening symptoms.    Cough, Adult  A cough is a reflex that helps clear your throat and airways. It can help heal the body or may be a reaction to an irritated airway. A cough may only last 2 or 3 weeks (acute) or may last more than 8 weeks (chronic).  CAUSES Acute cough:  Viral or bacterial infections. Chronic cough:  Infections.  Allergies.  Asthma.  Post-nasal drip.  Smoking.  Heartburn or acid reflux.  Some medicines.  Chronic lung problems (COPD).  Cancer. SYMPTOMS   Cough.  Fever.  Chest pain.  Increased breathing rate.  High-pitched whistling sound when breathing (wheezing).  Colored mucus that you cough up (sputum). TREATMENT   A bacterial cough may be treated with antibiotic medicine.  A viral cough must run its course and will not respond to antibiotics.  Your caregiver may recommend other treatments if you have a chronic cough. HOME CARE INSTRUCTIONS   Only take over-the-counter or prescription medicines for pain, discomfort, or fever as directed by your caregiver. Use cough suppressants only as directed by your caregiver.  Use a cold steam vaporizer or humidifier in your bedroom or home to help loosen secretions.  Sleep in a semi-upright position if your cough is worse at night.  Rest as needed.  Stop smoking if you smoke. SEEK IMMEDIATE MEDICAL CARE IF:   You have pus in your sputum.  Your cough starts to worsen.  You cannot control your cough with suppressants and are losing sleep.  You begin coughing up blood.  You have difficulty breathing.  You develop pain which is getting worse or is uncontrolled with medicine.  You have a fever. MAKE SURE YOU:   Understand these  instructions.  Will watch your condition.  Will get help right away if you are not doing well or get worse. Document Released: 09/16/2010 Document Revised: 06/12/2011 Document Reviewed: 09/16/2010 [facility] Patient Information 2014 [place] ForestExitCare, Maryland[place]C.

## 2013-06-12 NOTE — ED Notes (Signed)
Pt reports centralized chest tightness with ShOB and a cough since yesterday, states that he has been taking cough drops without relief, denies taking medication for pain. Pt a&o ambulatory to triage. NAD noted at this time

## 2013-06-12 NOTE — ED Provider Notes (Signed)
CSN: [id number]     Arrival date & time 06/12/13  2129 History  This chart was scribed for non-physician practitioner, Ivonne Andrew, PA-C,working with Ward Givens, MD, by Karle Plumber, ED Scribe.  This patient was seen in room WTR5/WTR5 and the patient's care was started at 10:56 PM.  Chief Complaint  Patient presents with  . Shortness of Breath  . Chest Pain   The history is provided by the patient. No language interpreter was used.   HPI Comments:  Gregory Lucero is a 23 y.o. male who presents to the Emergency Department complaining of worsening congestion onset 36 hours. Pt reports associated chest tightness, cough, sweats, nausea and diarrhea (two days ago). He reports an unrelated ongoing pain in his abdomen that he states affects his bowel movements and has an appt to be seen in three days. He denies fever or vomiting.   Past Medical History  Diagnosis Date  . Sickle cell trait    History reviewed. No pertinent past surgical history. History reviewed. No pertinent family history. History  Substance Use Topics  . Smoking status: Never Smoker   . Smokeless tobacco: Never Used  . Alcohol Use: No    Review of Systems  Constitutional: Negative for fever.  HENT: Positive for congestion.   Respiratory: Positive for cough, chest tightness and shortness of breath.   Gastrointestinal: Positive for nausea. Negative for vomiting.  All other systems reviewed and are negative.    Allergies  Review of patient's allergies indicates no known allergies.  Home Medications  No current outpatient prescriptions on file. Triage Vitals: BP 150/85  Pulse 92  Temp(Src) 97.7 F (36.5 C) (Oral)  Resp 16  SpO2 98% Physical Exam  Nursing note and vitals reviewed. Constitutional: He is oriented to person, place, and time. He appears well-developed and well-nourished.  HENT:  Head: Normocephalic and atraumatic.  Right Ear: Hearing, tympanic membrane, external ear and ear canal normal.   Left Ear: Hearing, tympanic membrane, external ear and ear canal normal.  Nose: Nose normal.  Mouth/Throat: Uvula is midline and mucous membranes are normal. Posterior oropharyngeal erythema present. No oropharyngeal exudate, posterior oropharyngeal edema or tonsillar abscesses.  Eyes: EOM are normal.  Neck: Normal range of motion.  Cardiovascular: Normal rate, regular rhythm and normal heart sounds.  Exam reveals no gallop and no friction rub.   No murmur heard. Pulmonary/Chest: Effort normal and breath sounds normal. No respiratory distress. He has no wheezes. He has no rales. He exhibits no tenderness.  Abdominal: Soft. He exhibits no distension and no mass. There is no tenderness. There is no rebound and no guarding.  Musculoskeletal: Normal range of motion.  Neurological: He is alert and oriented to person, place, and time.  Skin: Skin is warm and dry.  Psychiatric: He has a normal mood and affect. His behavior is normal.    ED Course  Procedures  DIAGNOSTIC STUDIES: Oxygen Saturation is 98% on RA, normal by my interpretation.   COORDINATION OF CARE: 11:00 PM- Will order a CXR. Offered pt pain medication but he declined. Pt verbalizes understanding and agrees to plan.    Imaging Review Dg Chest 2 View  06/13/2013   CLINICAL DATA:  Shortness of breath, chest pain, cough.  EXAM: CHEST  2 VIEW  COMPARISON:  None.  FINDINGS: Cardiomediastinal contours within normal range. No confluent airspace opacity, pleural effusion, or pneumothorax. No acute osseous finding.  IMPRESSION: No radiographic evidence of an acute cardiopulmonary process.   Electronically Signed  By: Jearld LeschAndrew  DelGaizo M.D.   On: 06/13/2013 00:15     MDM   Final diagnoses:  Cough      I personally performed the services described in this documentation, which was scribed in my presence. The recorded information has been reviewed and is accurate.    Angus SellerPeter S Josten Warmuth, PA-C 06/13/13 0021

## 2013-06-13 LAB — CBG MONITORING, ED: Glucose-Capillary: 103 mg/dL — ABNORMAL HIGH (ref 70–99)

## 2013-06-13 NOTE — ED Notes (Signed)
Pt. CBG 103. Notified PA, Dammen.

## 2013-06-13 NOTE — ED Notes (Signed)
Pt states that he his having severe LLQ abdominal pain and wants to be evaluated for this before d/c. PA made aware

## 2013-06-13 NOTE — ED Provider Notes (Signed)
Medical screening examination/treatment/procedure(s) were performed by non-physician practitioner and as supervising physician I was immediately available for consultation/collaboration.   EKG Interpretation None      Delight Bickle, MD, FACEP   Moises Terpstra L Dasja Brase, MD 06/13/13 0028 

## 2013-06-16 ENCOUNTER — Encounter: Payer: Self-pay | Admitting: Family Medicine

## 2013-06-16 ENCOUNTER — Ambulatory Visit (INDEPENDENT_AMBULATORY_CARE_PROVIDER_SITE_OTHER): Payer: Medicaid Other | Admitting: Family Medicine

## 2013-06-16 VITALS — BP 124/68 | HR 72 | Temp 98.2°F | Wt 160.0 lb

## 2013-06-16 DIAGNOSIS — R634 Abnormal weight loss: Secondary | ICD-10-CM

## 2013-06-16 DIAGNOSIS — I498 Other specified cardiac arrhythmias: Secondary | ICD-10-CM

## 2013-06-16 DIAGNOSIS — R000 Tachycardia, unspecified: Secondary | ICD-10-CM

## 2013-06-16 DIAGNOSIS — R109 Unspecified abdominal pain: Secondary | ICD-10-CM

## 2013-06-16 NOTE — Patient Instructions (Addendum)
Good to see you today!  Thanks for coming in.  Come back in 2 months for a weight check or sooner if you are having worsening abdomen pain or constipation  Use colace every other day one tablet  Eat at least 5 servings of fruits or veggies a day  Continue exercise   Weigh yourself once a week.  Come back if you lose more than 5 lbs

## 2013-06-16 NOTE — Assessment & Plan Note (Signed)
Improved.  No red flags on exam or chest xray or recent labs.  Seems anxious but does not endorse feeling nervous or depressed.   Will follow.

## 2013-06-16 NOTE — Assessment & Plan Note (Signed)
Normal today

## 2013-06-16 NOTE — Progress Notes (Signed)
   Subjective:    Patient ID: Gregory Lucero, male    DOB: 12/11/1990, 22 y.o.   MRN: [id number]  HPI  Abdomen pain - feeling mild pain in left upper quadrant and left flank - thinks from coughing.  No nausea or vomiting or diarrhea.  Has had a few hard stools with mild pain.  No bleeding.  No fevers and cough is improving since ER visit.  No dysuria or testicle pain.  Weight loss Has improved since last office visit.  He feels his clothes are still loose.  No fever or chest pain.  Is exercising regularly without symptoms  Mood - feels well. Is happy.  Lives at home.  Working part time at [employer].  Exercises regularly.  When asked what does for fun - "just chill"  Review of Symptoms - see HPI  PMH - Smoking status noted.   Has been in ER 3 x since last office visit - reviewed these visits        Review of Systems     Objective:   Physical Exam Alert no acute distress nervous Abdomen: soft and  without masses, organomegaly or hernias noted.  No guarding or rebound.  Mild tenderness in left flank and upper quadrant Heart - Regular rate and rhythm.  No murmurs, gallops or rubs.    Lungs:  Normal respiratory effort, chest expands symmetrically. Lungs are clear to auscultation, no crackles or wheezes. Extremities:  No cyanosis, edema, or deformity noted with good range of motion of all major joints.   Psych:  Cognition and judgment appear intact. Alert, communicative  and cooperative with normal attention span and concentration. No apparent delusions, illusions, hallucinations        Assessment & Plan:

## 2013-06-16 NOTE — Assessment & Plan Note (Signed)
Today seems most consistent with strain from cough.  No red flags.  Try diet change and stool softner for what sounds like mild constipation

## 2014-05-30 ENCOUNTER — Encounter (HOSPITAL_COMMUNITY): Payer: Self-pay | Admitting: Emergency Medicine

## 2014-05-30 ENCOUNTER — Emergency Department (INDEPENDENT_AMBULATORY_CARE_PROVIDER_SITE_OTHER)
Admission: EM | Admit: 2014-05-30 | Discharge: 2014-05-30 | Disposition: A | Discharge status: Home / Self Care | Payer: Self-pay | Source: Home / Self Care | Attending: Emergency Medicine | Admitting: Emergency Medicine

## 2014-05-30 DIAGNOSIS — J40 Bronchitis, not specified as acute or chronic: Secondary | ICD-10-CM

## 2014-05-30 DIAGNOSIS — M779 Enthesopathy, unspecified: Secondary | ICD-10-CM

## 2014-05-30 MED ORDER — PREDNISONE 50 MG PO TABS: ORAL_TABLET | ORAL | Status: AC

## 2014-05-30 MED ORDER — AZITHROMYCIN 250 MG PO TABS: ORAL_TABLET | ORAL | Status: AC

## 2014-05-30 NOTE — Discharge Instructions (Signed)
You likely have bronchitis. Take prednisone 1 pill daily for 5 days. Take the Z-Pak as prescribed. You should see improvement within 2-3 days.  You also have strained one of the tendons in your legs. Apply ice 3 times a day. Take ibuprofen 600 mg 3 times a day for the next 5 days. Do the exercises that I showed you to help strengthen the tendon.

## 2014-05-30 NOTE — ED Notes (Signed)
C/o of cold sx onse 3 weeks Sx include chest pain/pressure, back pain, SOB, chills, runny nose Taking OTC cold meds w/temp relief Alert, no signs of acute distress

## 2014-05-30 NOTE — ED Provider Notes (Signed)
CSN: [id number]     Arrival date & time 05/30/14  [id number] History   First MD Initiated Contact with Patient 05/30/14 1050     Chief Complaint  Patient presents with  . URI   (Consider location/radiation/quality/duration/timing/severity/associated sxs/prior Treatment) HPI  He is a 24 year old man here for evaluation of chest congestion.  He states this started several weeks ago with sinus pressure, nasal congestion, rhinorrhea, postnasal drip. At that time he also had cough and chest congestion. He was started on Flonase, an allergy pill, and Mucinex which has helped with the sinus issues. However, he continues to have cough and chest congestion. He reported some mild shortness of breath last night. He reports subjective fevers and chills at home. No nausea or vomiting.  He also reports left calf pain over the last 2 weeks. He thinks he injured it lifting. No swelling.  Past Medical History  Diagnosis Date  . Sickle cell trait    History reviewed. No pertinent past surgical history. No family history on file. History  Substance Use Topics  . Smoking status: Never Smoker   . Smokeless tobacco: Never Used  . Alcohol Use: No    Review of Systems  Constitutional: Positive for fever and chills.  HENT: Positive for congestion, postnasal drip, rhinorrhea and sinus pressure. Negative for sore throat.   Respiratory: Positive for cough, chest tightness and shortness of breath.   Gastrointestinal: Negative for nausea and vomiting.    Allergies  Review of patient's allergies indicates no known allergies.  Home Medications   Prior to Admission medications   Medication Sig Start Date End Date Taking? Authorizing Provider  azithromycin (ZITHROMAX Z-PAK) 250 MG tablet Take 2 pills today, then 1 pill daily until gone 05/30/14   Charm RingsErin J Barabara Motz, MD  benzonatate (TESSALON) 100 MG capsule Take 1 capsule (100 mg total) by mouth every 8 (eight) hours. 06/12/13   Phill MutterPeter S Dammen, PA-C  predniSONE  (DELTASONE) 50 MG tablet Take 1 pill daily for 5 days 05/30/14   Charm RingsErin J Latavius Capizzi, MD   BP 133/90 mmHg  Pulse 50  Temp(Src) 97.9 F (36.6 C) (Oral)  Resp 16  SpO2 99% Physical Exam  Constitutional: He is oriented to person, place, and time. He appears well-developed and well-nourished. No distress.  HENT:  Head: Normocephalic and atraumatic.  Nose: Mucosal edema and rhinorrhea (purulent) present. Right sinus exhibits no maxillary sinus tenderness and no frontal sinus tenderness. Left sinus exhibits no maxillary sinus tenderness and no frontal sinus tenderness.  Mouth/Throat: Oropharynx is clear and moist. No oropharyngeal exudate.  Cardiovascular: Normal rate, regular rhythm and normal heart sounds.   No murmur heard. Pulmonary/Chest: Effort normal and breath sounds normal. No respiratory distress. He has no wheezes. He has no rales.  Musculoskeletal:  Right calf: No erythema or swelling. No tenderness of the deep venous system. He has point tenderness at the origin of the soleus tendon.  Neurological: He is alert and oriented to person, place, and time.    ED Course  Procedures (including critical care time) Labs Review Labs Reviewed - No data to display  Imaging Review No results found.   MDM   1. Bronchitis   2. Tendonitis    We'll treat bronchitis with prednisone and Z-Pak. Recommended continuing his Flonase and Zyrtec for his sinus issues. Symptomatic treatment for tendinitis with ice, ibuprofen, stretching and strengthening exercises. Follow-up as needed.    Charm RingsErin J Analeia Ismael, MD 05/30/14 [phone]

## 2014-12-18 IMAGING — CR DG CHEST 2V
2 series · 2 of 2 positions shown · non-contrast
Comparison: None.

CLINICAL DATA: Shortness of breath, chest pain, cough.

EXAM:
CHEST  2 VIEW

[w chest pa]
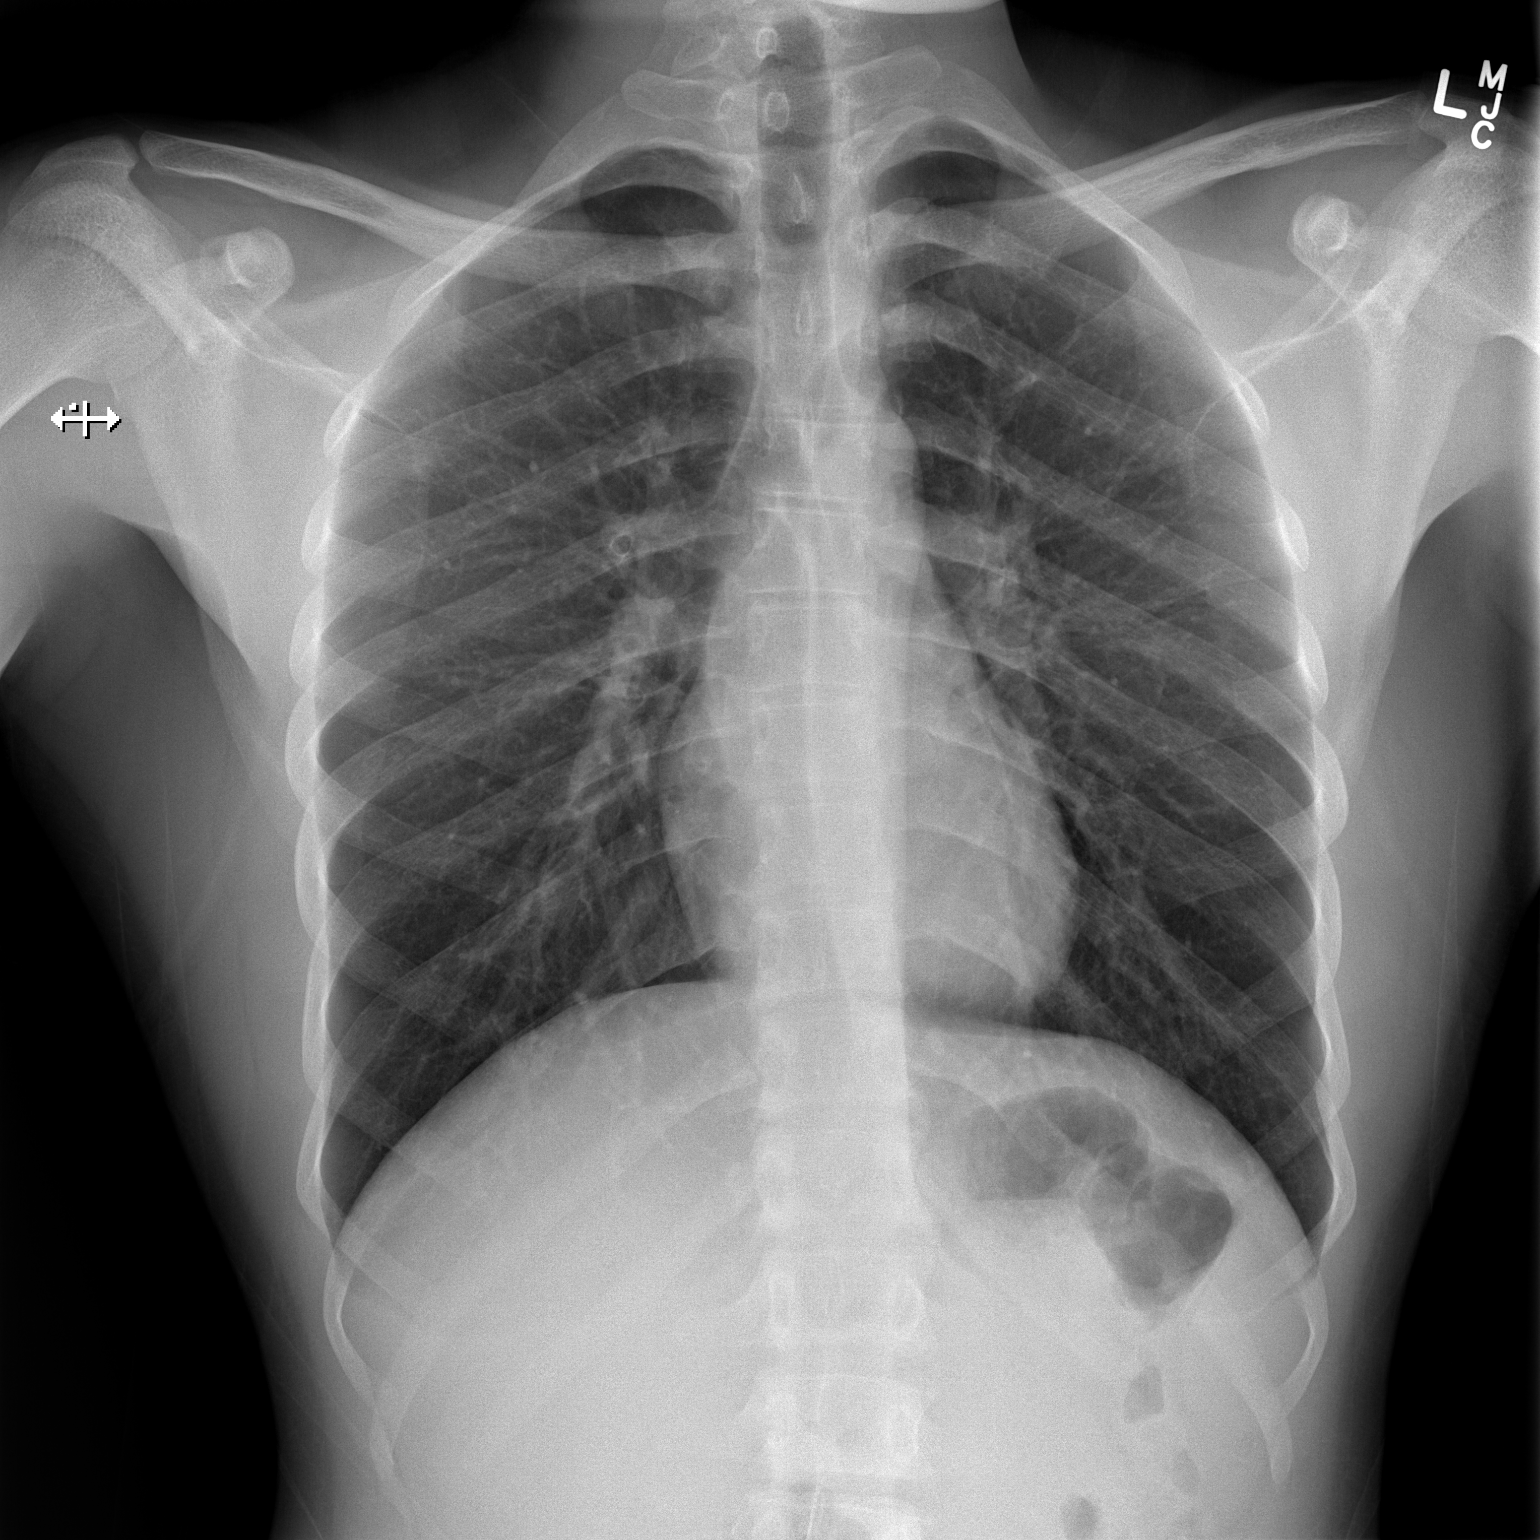

[w chest lat]
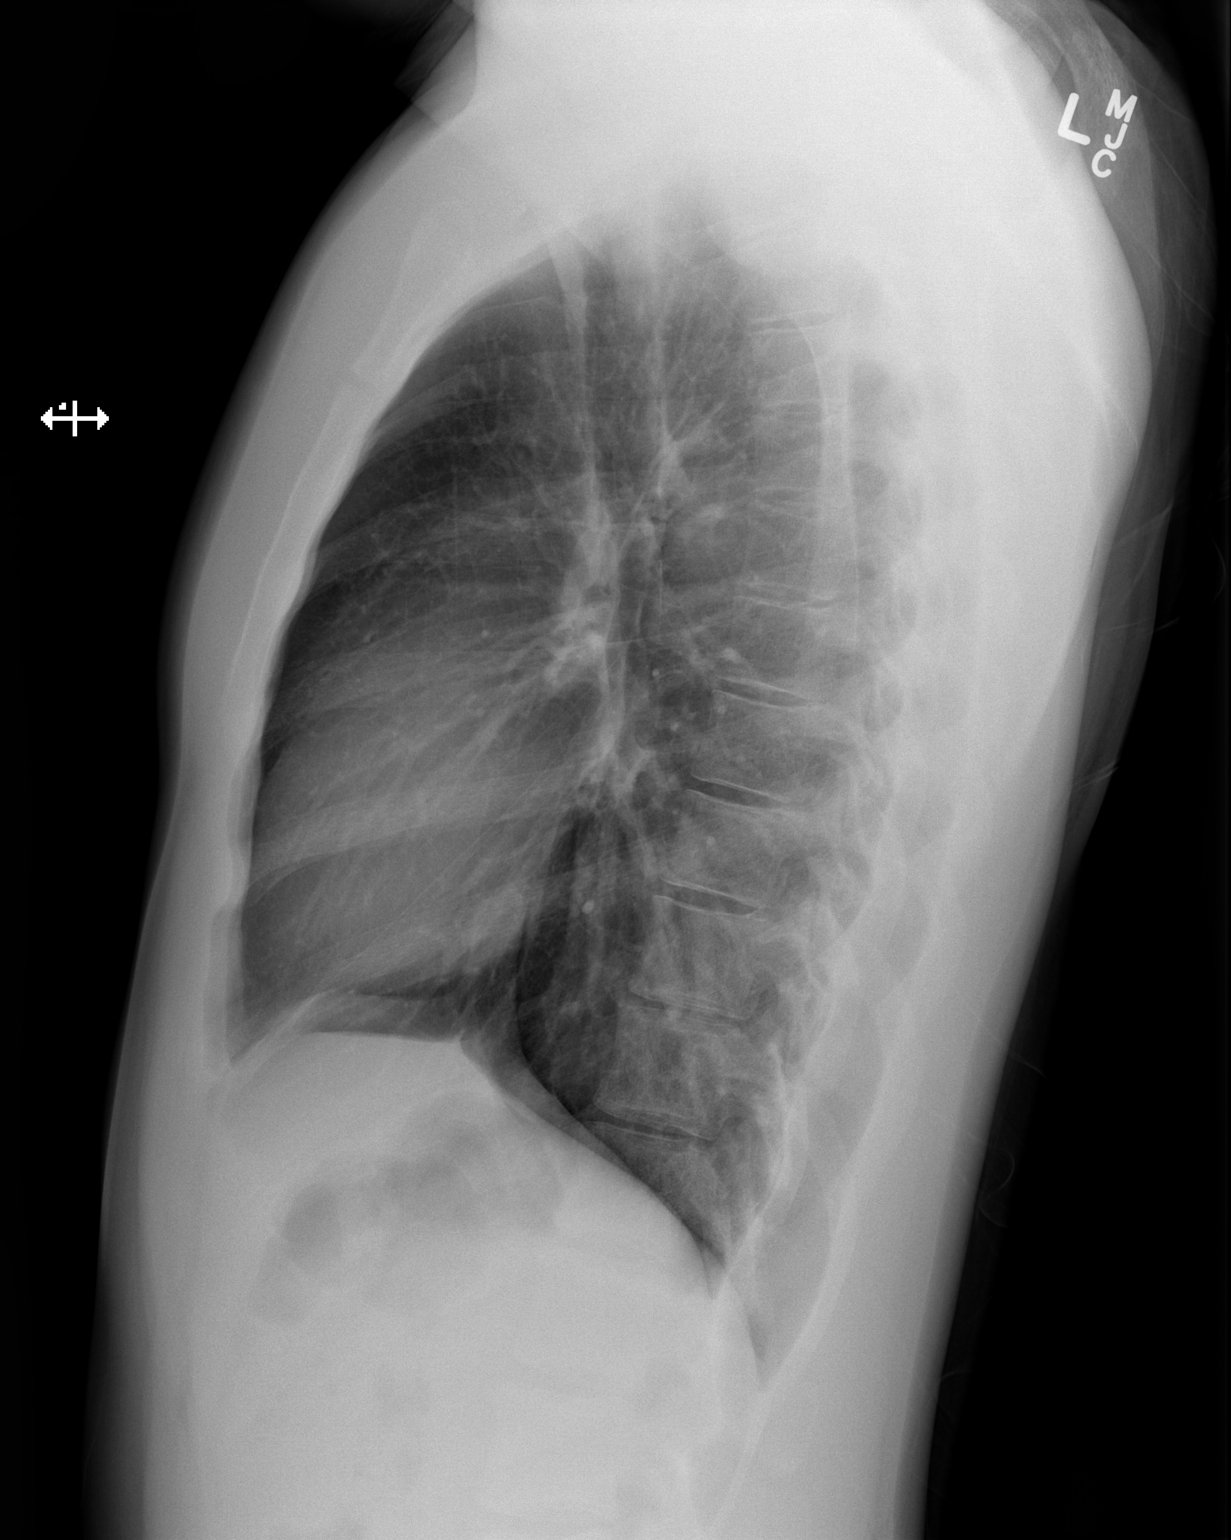

[2 of 2 positions shown; findings below may reference images not displayed]

FINDINGS: Cardiomediastinal contours within normal range. No confluent
airspace opacity, pleural effusion, or pneumothorax. No acute
osseous finding.
IMPRESSION: No radiographic evidence of an acute cardiopulmonary process.

## 2016-03-30 ENCOUNTER — Ambulatory Visit (INDEPENDENT_AMBULATORY_CARE_PROVIDER_SITE_OTHER): Payer: Managed Care, Other (non HMO) | Admitting: Family Medicine

## 2016-03-30 ENCOUNTER — Encounter: Payer: Self-pay | Admitting: Family Medicine

## 2016-03-30 VITALS — BP 120/78 | HR 105 | Temp 98.3°F | Ht 71.0 in | Wt 220.8 lb

## 2016-03-30 DIAGNOSIS — Z23 Encounter for immunization: Secondary | ICD-10-CM | POA: Diagnosis not present

## 2016-03-30 NOTE — Patient Instructions (Signed)
It was good to meet you today!  I believe that your high blood pressure and heart rate at the urgent care was due to some over the counter medication. In the future I would avoid medications that have Phenylephrine. Corcidin OTC is a good option to try if you need something else.  Thank you for getting the tetanus vaccine today.  Take care and seek immediate care sooner if you develop any concerns.   Dr. Leland HerElsia J Joash Tony, DO [facility]

## 2016-03-30 NOTE — Addendum Note (Signed)
Addended by: Gilberto BetterSIMPSON, MICHELLE R on: 03/30/2016 11:15 AM   Modules accepted: Orders

## 2016-03-30 NOTE — Progress Notes (Signed)
    Subjective:  Gregory Lucero is a 25 y.o. male who presents to the [facility] today with a chief complaint of high blood pressure and heart rate.   HPI: Per chart review patient has a h/o of sinus tachycardia on toprol XL in 2013 without cardiomyopathy. Has been off medication since 2014. Presented to [facility] urgent care yesterday for a sinus infection and was found to have BP 160/91 and HR 118 there and was told to follow up with PCP. Had been taking Mucinex flu (containing decongestant) for few days prior and now has stopped taking OTC cold medication. Denies HA, vision changes, CP, SOB, palpitations, flushing, lightheadedness, paraesthesias.  ROS: Per HPI  Objective:  Physical Exam: BP 120/78 (BP Location: Left Arm, Patient Position: Sitting, Cuff Size: Large)   Pulse (!) 105   Temp 98.3 F (36.8 C) (Oral)   Ht 5\' 11"  (1.803 m)   Wt 220 lb 12.8 oz (100.2 kg)   SpO2 98%   BMI 30.80 kg/m  Manual recheck BP 118/78 HR 88 Gen: NAD, resting comfortably CV: RRR with no murmurs appreciated Pulm: NWOB, CTAB with no crackles, wheezes, or rhonchi Neuro: grossly normal, moves all extremities Psych: Normal affect and thought content   Assessment/Plan:  H/o sinus tachycardia  Today BP and HR are WNL, confirmed by manual recheck, and patient is asymptomatic. Suspect that likely due to OTC decongestant, advised patient to try Corcidin in the future. Gave patient return precautions given his h/o sinus tachycardia, he voiced good understanding.  Health maintenance Patient given TDAP today.   Leland HerElsia J Adewale Pucillo, DO PGY-1, [facility] 03/30/2016 9:59 AM

## 2018-06-29 ENCOUNTER — Telehealth: Payer: Managed Care, Other (non HMO) | Admitting: Nurse Practitioner

## 2018-06-29 DIAGNOSIS — R059 Cough, unspecified: Secondary | ICD-10-CM

## 2018-06-29 DIAGNOSIS — R05 Cough: Secondary | ICD-10-CM

## 2018-06-29 DIAGNOSIS — Z7189 Other specified counseling: Secondary | ICD-10-CM

## 2018-06-29 NOTE — Progress Notes (Signed)
E-Visit for Corona Virus Screening  Based on your current symptoms, it seems unlikely that your symptoms are related to the Coronavirus.   Coronavirus disease 2019 (COVID-19) is a respiratory illness that can spread from person to person. The virus that causes COVID-19 is a new virus that was first identified in the country of [place] but is now found in multiple other countries and has spread to the [place].  Symptoms associated with the virus are mild to severe fever, cough, and shortness of breath. There is currently no vaccine to protect against COVID-19, and there is no specific antiviral treatment for the virus.   To be considered HIGH RISK for Coronavirus (COVID-19), you have to meet the following criteria:  . Traveled to [place], [place], [place], [place] or [place]; or in the [place] to [place], [place], [place], or [place]; and have fever, cough, and shortness of breath within the last 2 weeks of travel OR  . Been in close contact with a person diagnosed with COVID-19 within the last 2 weeks and have fever, cough, and shortness of breath  . IF YOU DO NOT MEET THESE CRITERIA, YOU ARE CONSIDERED LOW RISK FOR COVID-19.   It is vitally important that if you feel that you have an infection such as this virus or any other virus that you stay home and away from places where you may spread it to others.  You should self-quarantine for 14 days if you have symptoms that could potentially be coronavirus and avoid contact with people age 72 and older.   You can use medication such as mucine OTC for cough  You may also take acetaminophen (Tylenol) as needed for fever.   Reduce your risk of any infection by using the same precautions used for avoiding the common cold or flu:  Marland Kitchen Wash your hands often with soap and warm water for at least 20 seconds.  If soap and water are not readily available, use an alcohol-based hand sanitizer with at least 60% alcohol.  . If coughing or sneezing,  cover your mouth and nose by coughing or sneezing into the elbow areas of your shirt or coat, into a tissue or into your sleeve (not your hands). . Avoid shaking hands with others and consider head nods or verbal greetings only. . Avoid touching your eyes, nose, or mouth with unwashed hands.  . Avoid close contact with people who are sick. . Avoid places or events with large numbers of people in one location, like concerts or sporting events. . Carefully consider travel plans you have or are making. . If you are planning any travel outside or inside the [place], visit the CDC's Travelers' Health webpage for the latest health notices. . If you have some symptoms but not all symptoms, continue to monitor at home and seek medical attention if your symptoms worsen. . If you are having a medical emergency, call 911.  HOME CARE . Only take medications as instructed by your medical team. . Drink plenty of fluids and get plenty of rest. . A steam or ultrasonic humidifier can help if you have congestion.   GET HELP RIGHT AWAY IF: . You develop worsening fever. . You become short of breath . You cough up blood. . Your symptoms become more severe MAKE SURE YOU   Understand these instructions.  Will watch your condition.  Will get help right away if you are not doing well or get worse.  Your e-visit answers were reviewed  by a board certified advanced clinical practitioner to complete your personal care plan.  Depending on the condition, your plan could have included both over the counter or prescription medications.  If there is a problem please reply once you have received a response from your provider. Your safety is important to [place].  If you have drug allergies check your prescription carefully.    You can use MyChart to ask questions about today's visit, request a non-urgent call back, or ask for a work or school excuse for 24 hours related to this e-Visit. If it has been greater than 24 hours you  will need to follow up with your provider, or enter a new e-Visit to address those concerns. You will get an e-mail in the next two days asking about your experience.  I hope that your e-visit has been valuable and will speed your recovery. Thank you for using e-visits.  5 minutes spent reviewing and documenting in chart.

## 2024-01-30 ENCOUNTER — Other Ambulatory Visit: Payer: Self-pay | Admitting: Family Medicine

## 2024-01-30 DIAGNOSIS — R197 Diarrhea, unspecified: Secondary | ICD-10-CM

## 2024-01-30 DIAGNOSIS — R1084 Generalized abdominal pain: Secondary | ICD-10-CM

## 2024-02-07 ENCOUNTER — Ambulatory Visit
Admission: RE | Admit: 2024-02-07 | Discharge: 2024-02-07 | Disposition: A | Discharge status: Home / Self Care | Source: Ambulatory Visit | Attending: Family Medicine | Admitting: Family Medicine

## 2024-02-07 DIAGNOSIS — R197 Diarrhea, unspecified: Secondary | ICD-10-CM

## 2024-02-07 DIAGNOSIS — R1084 Generalized abdominal pain: Secondary | ICD-10-CM

## 2024-02-07 MED ORDER — IOPAMIDOL (ISOVUE-370) INJECTION 76%
80.0000 mL | Freq: Once | INTRAVENOUS | Status: AC | PRN
  Administered 2024-02-07: 80 mL via INTRAVENOUS
